# Patient Record
Sex: Male | Born: 1980 | Hispanic: Yes | Marital: Single | Smoking: Never smoker
Health system: Southern US, Community
[De-identification: ages and names within clinical notes are randomized; demographics above are authoritative.]

## PROBLEM LIST (undated history)

## (undated) DIAGNOSIS — K3580 Unspecified acute appendicitis: Secondary | ICD-10-CM

## (undated) HISTORY — PX: APPENDECTOMY: SHX54

---

## 2013-02-20 ENCOUNTER — Encounter (HOSPITAL_COMMUNITY): Payer: Self-pay | Admitting: Emergency Medicine

## 2013-02-20 ENCOUNTER — Observation Stay (HOSPITAL_COMMUNITY)
Admission: EM | Admit: 2013-02-20 | Discharge: 2013-02-21 | Disposition: A | Discharge status: Home / Self Care | Payer: Medicaid Other | Attending: General Surgery | Admitting: General Surgery

## 2013-02-20 DIAGNOSIS — K3580 Unspecified acute appendicitis: Principal | ICD-10-CM | POA: Insufficient documentation

## 2013-02-20 HISTORY — DX: Unspecified acute appendicitis: K35.80

## 2013-02-20 LAB — COMPREHENSIVE METABOLIC PANEL
Albumin: 4.4 g/dL (ref 3.5–5.2)
BUN: 23 mg/dL (ref 6–23)
CO2: 24 mEq/L (ref 19–32)
Calcium: 9.7 mg/dL (ref 8.4–10.5)
Chloride: 101 mEq/L (ref 96–112)
Creatinine, Ser: 0.86 mg/dL (ref 0.50–1.35)
GFR calc non Af Amer: >90 mL/min (ref >90)
Total Bilirubin: 0.7 mg/dL (ref 0.3–1.2)

## 2013-02-20 LAB — CBC WITH DIFFERENTIAL/PLATELET
Basophils Absolute: 0 10*3/uL (ref 0.0–0.1)
Eosinophils Absolute: 0.1 10*3/uL (ref 0.0–0.7)
HCT: 44.4 % (ref 39.0–52.0)
Hemoglobin: 15.8 g/dL (ref 13.0–17.0)
Lymphocytes Relative: 10 % — ABNORMAL LOW (ref 12–46)
Lymphs Abs: 1.6 10*3/uL (ref 0.7–4.0)
MCH: 30 pg (ref 26.0–34.0)
Monocytes Relative: 7 % (ref 3–12)
Neutro Abs: 12.8 10*3/uL — ABNORMAL HIGH (ref 1.7–7.7)
Neutrophils Relative %: 82 % — ABNORMAL HIGH (ref 43–77)
Platelets: 226 10*3/uL (ref 150–400)
RBC: 5.26 MIL/uL (ref 4.22–5.81)
RDW: 12.3 % (ref 11.5–15.5)
WBC: 15.6 10*3/uL — ABNORMAL HIGH (ref 4.0–10.5)

## 2013-02-20 LAB — LIPASE, BLOOD: Lipase: 40 U/L (ref 11–59)

## 2013-02-20 MED ORDER — IOHEXOL 300 MG/ML  SOLN: 50.0000 mL | Freq: Once | INTRAMUSCULAR | Status: AC | PRN

## 2013-02-20 MED ORDER — SODIUM CHLORIDE 0.9 % IV BOLUS (SEPSIS)
1000.0000 mL | Freq: Once | INTRAVENOUS | Status: AC
  Administered 2013-02-21: 1000 mL via INTRAVENOUS

## 2013-02-20 MED ORDER — HYDROMORPHONE HCL PF 1 MG/ML IJ SOLN
1.0000 mg | Freq: Once | INTRAMUSCULAR | Status: AC
  Administered 2013-02-21: 1 mg via INTRAVENOUS
  Filled 2013-02-20: qty 1

## 2013-02-20 MED ORDER — ONDANSETRON HCL 4 MG/2ML IJ SOLN
4.0000 mg | Freq: Once | INTRAMUSCULAR | Status: AC
  Administered 2013-02-20: 4 mg via INTRAVENOUS
  Filled 2013-02-20: qty 2

## 2013-02-20 NOTE — ED Provider Notes (Signed)
CSN: [id number]     Arrival date & time 02/20/13  2244 History   First MD Initiated Contact with Patient 02/20/13 2321     Chief Complaint  Patient presents with  . Abdominal Pain   (Consider location/radiation/quality/duration/timing/severity/associated sxs/prior Treatment) The history is provided by the patient and a friend. A language interpreter was used.  Brandon Bowers is a 32 y/o M with no significant PMHx presenting to the ED with abdominal pain that started at approximately 3:00PM this afternoon that has progressively gotten worse over the course of the day. Patient reported that the pain started out as a 3/10, stated that the pain has gotten worse with time. Patient reported that the pain is localized to the right lower quadrant, described as an inflammatory process without radiation. Stated that he has been experiencing mild back pain associated with the RLQ pain. Stated that having his legs extended makes the pain worse, stated that the flexion of the legs make the pain better. Stated that at approximately 5:00PM he noticed that he was experiencing epigastric discomfort described as a gaseous, bloating sensation. Patient reported that he had raw oysters. Stated that he has not had a BM today. Denied fever, nausea, vomiting, diarrhea, chills, chest pain, shortness of breath, difficulty breathing, melena, hematochezia, neck pain, neck stiffness, penile pain, penile discharge, penile swelling, testicular pain, testicular swelling, scrotal pain. PCP none Patient predominantly speaks [place], friend with patient who interpreted interview.   History reviewed. No pertinent past medical history. History reviewed. No pertinent past surgical history. History reviewed. No pertinent family history. History  Substance Use Topics  . Smoking status: Never Smoker   . Smokeless tobacco: Never Used  . Alcohol Use: Yes     Comment: socially    Review of Systems  Constitutional: Negative for  fever and chills.  HENT: Negative for neck pain and neck stiffness.   Respiratory: Negative for chest tightness and shortness of breath.   Cardiovascular: Negative for chest pain.  Gastrointestinal: Positive for abdominal pain. Negative for nausea and vomiting.  Genitourinary: Negative for dysuria, hematuria, decreased urine volume, discharge, penile swelling, scrotal swelling, difficulty urinating, penile pain and testicular pain.  Musculoskeletal: Positive for back pain.  Neurological: Negative for dizziness, weakness and headaches.  All other systems reviewed and are negative.    Allergies  Review of patient's allergies indicates no known allergies.  Home Medications   Current Outpatient Rx  Name  Route  Sig  Dispense  Refill  . HYDROcodone-acetaminophen (NORCO/VICODIN) 5-325 MG per tablet   Oral   Take 1-2 tablets by mouth every 4 (four) hours as needed.   40 tablet   0   . ibuprofen (ADVIL,MOTRIN) 200 MG tablet      You can  Take 2-3 of these every 6 hours for pain.   30 tablet   0    BP 112/69  Pulse 74  Temp(Src) 97.8 F (36.6 C) (Oral)  Resp 18  Ht 5\' 7"  (1.702 m)  Wt 160 lb (72.576 kg)  BMI 25.05 kg/m2  SpO2 96% Physical Exam  Nursing note and vitals reviewed. Constitutional: He is oriented to person, place, and time. He appears well-developed and well-nourished. No distress.  HENT:  Head: Normocephalic and atraumatic.  Mouth/Throat: Oropharynx is clear and moist. No oropharyngeal exudate.  Eyes: Conjunctivae and EOM are normal. Pupils are equal, round, and reactive to light. Right eye exhibits no discharge. Left eye exhibits no discharge.  Neck: Normal range of motion. Neck supple.  Negative  neck stiffness Negative nuchal rigidity Negative lymphadenopathy Negative meningeal signs  Cardiovascular: Normal rate, regular rhythm and normal heart sounds.  Exam reveals no friction rub.   No murmur heard. Pulses:      Radial pulses are 2+ on the right side,  and 2+ on the left side.       Dorsalis pedis pulses are 2+ on the right side, and 2+ on the left side.  Pulmonary/Chest: Effort normal and breath sounds normal. No respiratory distress. He has no wheezes. He has no rales.  Abdominal: Soft. Normal appearance and bowel sounds are normal. He exhibits no distension. There is tenderness in the right lower quadrant. There is rebound and tenderness at McBurney's point. There is no guarding and negative Murphy's sign.    Discomfort upon palpation to the RLQ Positive McBurney's point Positive psoas and obturator Positive rosvings  Lymphadenopathy:    He has no cervical adenopathy.  Neurological: He is alert and oriented to person, place, and time. He exhibits normal muscle tone. Coordination normal.  Skin: Skin is warm and dry. No rash noted. He is not diaphoretic. No erythema.  Psychiatric: He has a normal mood and affect. His behavior is normal. Thought content normal.    ED Course  Procedures (including critical care time)  1:53 AM Spoke with Dr. Ezzard Standing, [facility] General Surgery, discussed case and CT scan of abdomen findings consistent with acute appendicitis with elevation in WBC and lactic acid. Dr. Ezzard Standing aware and will come to see patient.   2:04 AM Spoke with patient and friend regarding lab and imaging results - discussed with patient that General Surgery is to come and see patient, discussed that patient will most likely need to get surgery performed, most likely need to get appendectomy.   Labs Review Labs Reviewed  CBC WITH DIFFERENTIAL - Abnormal; Notable for the following:    WBC 15.6 (*)    Neutrophils Relative % 82 (*)    Neutro Abs 12.8 (*)    Lymphocytes Relative 10 (*)    All other components within normal limits  CG4 I-STAT (LACTIC ACID) - Abnormal; Notable for the following:    Lactic Acid, Venous 3.11 (*)    All other components within normal limits  COMPREHENSIVE METABOLIC PANEL  LIPASE, BLOOD  URINALYSIS, ROUTINE W  REFLEX MICROSCOPIC   Imaging Review Ct Abdomen Pelvis W Contrast  02/21/2013   *RADIOLOGY REPORT*  Clinical Data: Severe right lower quadrant abdominal pain. Leukocytosis.  CT ABDOMEN AND PELVIS WITH CONTRAST  Technique:  Multidetector CT imaging of the abdomen and pelvis was performed following the standard protocol during bolus administration of intravenous contrast.  Contrast: OMNIPAQUE IOHEXOL 300 MG/ML  SOLN  Comparison: None.  Findings: Minimal bibasilar atelectasis is noted.  The liver and spleen are unremarkable in appearance.  The gallbladder is within normal limits.  The pancreas and adrenal glands are unremarkable.  The kidneys are unremarkable in appearance.  There is no evidence of hydronephrosis.  No renal or ureteral stones are seen.  No perinephric stranding is appreciated.  No free fluid is identified.  The small bowel is unremarkable in appearance.  The stomach is within normal limits.  No acute vascular abnormalities are seen.  The appendix is borderline prominent, measuring up to 8 mm in diameter, with minimal associated soft tissue stranding.  No free fluid is seen.  This could reflect very mild appendicitis.  There is no evidence of perforation or abscess formation.  The colon is unremarkable in appearance.  The bladder is moderately distended and grossly unremarkable in appearance.  A small urachal remnant is incidentally noted, with minimal associated fat.  The prostate remains normal in size.  No inguinal lymphadenopathy is seen.  No acute osseous abnormalities are identified.  There is partial sacralization of vertebral body L5.  IMPRESSION: Borderline prominent appendix, measuring up to 8 mm in diameter, with minimal associated soft tissue stranding.  This could reflect very mild appendicitis.  No evidence of perforation or abscess formation.  These results were called by telephone on 02/21/2013 at 01:31 a.m. to Dr. Rolan Bucco, who verbally acknowledged these results.    Original Report Authenticated By: Tonia Ghent, M.D.    MDM  No diagnosis found.  Patient presenting to the emergency department with abdominal pain that is localized to the right lower quadrant that started approximately 3:00 PM this afternoon. Patient reports that the pain feels like inflammation, localized to right lower quadrant without radiation. Stated he had associated symptoms of back pain. Denied nausea, vomiting, diarrhea, fever, chills. Friend who accompanied patient was interpreter for the interview. Alert and oriented. Heart rate and rhythm normal. Lungs clear to auscultation bilaterally. Pulses strong and palpable, distal and proximal bilaterally. Full range of motion to upper and lower extremities bilaterally. Bowel sounds normoactive in all 4 quadrants. Pain upon palpation localized to the right lower quadrant-positive psoas, positive obturator, positive Rovsing sign, positive McBurney's point. Suspicion high for appendicitis. Urine negative for infection, negative leukocytosis identified. CMP negative findings. Lipase negative elevation. CBC elevation of white blood cells noted, 15.6 with mild leukocytosis. Lactic acid elevated at 3.11. CT abdomen and pelvis with contrast identified appendix measuring up to 8 mm in diameter with minimal associated soft tissue stranding - mild appendicitis - no evidence of perforation or abscess formation noted. Discussed case with Dr. Ezzard Standing, [facility] General Surgery - patient to be taken to the OR for appendectomy to be performed. Discussed lab and imaging results with patient, discussed need for surgery - patient understood and agreed to plan. Patient stable, afebrile for transfer.      Raymon Mutton, PA-C 02/22/13 [phone]

## 2013-02-20 NOTE — ED Notes (Signed)
Pt reports severe RLQ sudden onset, pin point tenderness. Denies n/v/d.  Reports pain began at 1500 but increased dramatically 10 minutes ago.

## 2013-02-21 ENCOUNTER — Encounter (HOSPITAL_COMMUNITY): Payer: Self-pay | Admitting: Anesthesiology

## 2013-02-21 ENCOUNTER — Emergency Department (HOSPITAL_COMMUNITY): Payer: Medicaid Other | Admitting: Anesthesiology

## 2013-02-21 ENCOUNTER — Encounter (HOSPITAL_COMMUNITY): Payer: Self-pay

## 2013-02-21 ENCOUNTER — Encounter (HOSPITAL_COMMUNITY)
Admission: EM | Disposition: A | Discharge status: Home / Self Care | Payer: Self-pay | Source: Home / Self Care | Attending: Emergency Medicine

## 2013-02-21 ENCOUNTER — Encounter: Payer: Self-pay | Admitting: General Surgery

## 2013-02-21 ENCOUNTER — Emergency Department (HOSPITAL_COMMUNITY): Payer: Medicaid Other

## 2013-02-21 DIAGNOSIS — K3580 Unspecified acute appendicitis: Secondary | ICD-10-CM

## 2013-02-21 HISTORY — PX: LAPAROSCOPIC APPENDECTOMY: SHX408

## 2013-02-21 LAB — URINALYSIS, ROUTINE W REFLEX MICROSCOPIC
Hgb urine dipstick: NEGATIVE
Ketones, ur: NEGATIVE mg/dL
Leukocytes, UA: NEGATIVE
Nitrite: NEGATIVE
Specific Gravity, Urine: 1.017 (ref 1.005–1.030)
pH: 8 (ref 5.0–8.0)

## 2013-02-21 LAB — CG4 I-STAT (LACTIC ACID): Lactic Acid, Venous: 3.11 mmol/L — ABNORMAL HIGH (ref 0.5–2.2)

## 2013-02-21 SURGERY — APPENDECTOMY, LAPAROSCOPIC
Anesthesia: General | Site: Abdomen | Wound class: Clean Contaminated

## 2013-02-21 MED ORDER — IBUPROFEN 200 MG PO TABS: ORAL_TABLET | ORAL | Status: AC

## 2013-02-21 MED ORDER — CEFAZOLIN SODIUM-DEXTROSE 2-3 GM-% IV SOLR
INTRAVENOUS | Status: AC
  Filled 2013-02-21: qty 50

## 2013-02-21 MED ORDER — SODIUM CHLORIDE 0.9 % IV BOLUS (SEPSIS)
1000.0000 mL | Freq: Once | INTRAVENOUS | Status: AC
  Administered 2013-02-21: 1000 mL via INTRAVENOUS

## 2013-02-21 MED ORDER — HYDROCODONE-ACETAMINOPHEN 5-325 MG PO TABS
1.0000 | ORAL_TABLET | ORAL | Status: DC | PRN
  Administered 2013-02-21: 2 via ORAL
  Filled 2013-02-21: qty 2

## 2013-02-21 MED ORDER — MORPHINE SULFATE 2 MG/ML IJ SOLN: 1.0000 mg | INTRAMUSCULAR | Status: DC | PRN

## 2013-02-21 MED ORDER — HYDROCODONE-ACETAMINOPHEN 5-325 MG PO TABS: 1.0000 | ORAL_TABLET | ORAL | Status: DC | PRN

## 2013-02-21 MED ORDER — ROCURONIUM BROMIDE 100 MG/10ML IV SOLN
INTRAVENOUS | Status: DC | PRN
  Administered 2013-02-21: 35 mg via INTRAVENOUS

## 2013-02-21 MED ORDER — DEXTROSE 5 % IV SOLN
1.0000 g | Freq: Four times a day (QID) | INTRAVENOUS | Status: DC
  Administered 2013-02-21 (×2): 1 g via INTRAVENOUS
  Filled 2013-02-21 (×3): qty 1

## 2013-02-21 MED ORDER — KETOROLAC TROMETHAMINE 30 MG/ML IJ SOLN: 15.0000 mg | Freq: Once | INTRAMUSCULAR | Status: DC | PRN

## 2013-02-21 MED ORDER — NEOSTIGMINE METHYLSULFATE 1 MG/ML IJ SOLN
INTRAMUSCULAR | Status: DC | PRN
  Administered 2013-02-21: 4 mg via INTRAVENOUS

## 2013-02-21 MED ORDER — LACTATED RINGERS IR SOLN
Status: DC | PRN
  Administered 2013-02-21: 1000 mL

## 2013-02-21 MED ORDER — PROPOFOL 10 MG/ML IV BOLUS
INTRAVENOUS | Status: DC | PRN
  Administered 2013-02-21: 20 mg via INTRAVENOUS
  Administered 2013-02-21: 180 mg via INTRAVENOUS

## 2013-02-21 MED ORDER — IOHEXOL 300 MG/ML  SOLN
50.0000 mL | Freq: Once | INTRAMUSCULAR | Status: AC | PRN
  Administered 2013-02-21: 50 mL via ORAL

## 2013-02-21 MED ORDER — ONDANSETRON HCL 4 MG/2ML IJ SOLN
INTRAMUSCULAR | Status: DC | PRN
  Administered 2013-02-21: 4 mg via INTRAVENOUS

## 2013-02-21 MED ORDER — ONDANSETRON HCL 4 MG/2ML IJ SOLN: 4.0000 mg | Freq: Four times a day (QID) | INTRAMUSCULAR | Status: DC | PRN

## 2013-02-21 MED ORDER — MIDAZOLAM HCL 5 MG/5ML IJ SOLN
INTRAMUSCULAR | Status: DC | PRN
  Administered 2013-02-21: 2 mg via INTRAVENOUS

## 2013-02-21 MED ORDER — MEPERIDINE HCL 50 MG/ML IJ SOLN: 6.2500 mg | INTRAMUSCULAR | Status: DC | PRN

## 2013-02-21 MED ORDER — BUPIVACAINE HCL (PF) 0.25 % IJ SOLN
INTRAMUSCULAR | Status: DC | PRN
  Administered 2013-02-21: 23 mL

## 2013-02-21 MED ORDER — BUPIVACAINE HCL (PF) 0.25 % IJ SOLN
INTRAMUSCULAR | Status: AC
  Filled 2013-02-21: qty 30

## 2013-02-21 MED ORDER — LACTATED RINGERS IV SOLN
INTRAVENOUS | Status: DC | PRN
  Administered 2013-02-21 (×2): via INTRAVENOUS

## 2013-02-21 MED ORDER — DEXAMETHASONE SODIUM PHOSPHATE 10 MG/ML IJ SOLN
INTRAMUSCULAR | Status: DC | PRN
  Administered 2013-02-21: 10 mg via INTRAVENOUS

## 2013-02-21 MED ORDER — PROMETHAZINE HCL 25 MG/ML IJ SOLN: 6.2500 mg | INTRAMUSCULAR | Status: DC | PRN

## 2013-02-21 MED ORDER — CEFOXITIN SODIUM 1 G IV SOLR
INTRAVENOUS | Status: AC
  Filled 2013-02-21: qty 1

## 2013-02-21 MED ORDER — GLYCOPYRROLATE 0.2 MG/ML IJ SOLN
INTRAMUSCULAR | Status: DC | PRN
  Administered 2013-02-21: 0.6 mg via INTRAVENOUS

## 2013-02-21 MED ORDER — IBUPROFEN 600 MG PO TABS
600.0000 mg | ORAL_TABLET | Freq: Four times a day (QID) | ORAL | Status: DC | PRN
  Administered 2013-02-21 (×2): 600 mg via ORAL
  Filled 2013-02-21 (×3): qty 1

## 2013-02-21 MED ORDER — KCL IN DEXTROSE-NACL 20-5-0.45 MEQ/L-%-% IV SOLN
INTRAVENOUS | Status: DC
  Administered 2013-02-21: 06:00:00 via INTRAVENOUS
  Filled 2013-02-21 (×2): qty 1000

## 2013-02-21 MED ORDER — LIDOCAINE HCL (CARDIAC) 20 MG/ML IV SOLN
INTRAVENOUS | Status: DC | PRN
  Administered 2013-02-21: 75 mg via INTRAVENOUS

## 2013-02-21 MED ORDER — SUFENTANIL CITRATE 50 MCG/ML IV SOLN
INTRAVENOUS | Status: DC | PRN
  Administered 2013-02-21: 10 ug via INTRAVENOUS

## 2013-02-21 MED ORDER — DEXTROSE 5 % IV SOLN
2.0000 g | INTRAVENOUS | Status: DC | PRN
  Administered 2013-02-21: 2 g via INTRAVENOUS

## 2013-02-21 MED ORDER — SUCCINYLCHOLINE CHLORIDE 20 MG/ML IJ SOLN
INTRAMUSCULAR | Status: DC | PRN
  Administered 2013-02-21: 100 mg via INTRAVENOUS

## 2013-02-21 MED ORDER — FENTANYL CITRATE 0.05 MG/ML IJ SOLN: 25.0000 ug | INTRAMUSCULAR | Status: DC | PRN

## 2013-02-21 MED ORDER — HEPARIN SODIUM (PORCINE) 5000 UNIT/ML IJ SOLN
5000.0000 [IU] | Freq: Three times a day (TID) | INTRAMUSCULAR | Status: DC
  Administered 2013-02-21: 5000 [IU] via SUBCUTANEOUS
  Filled 2013-02-21 (×3): qty 1

## 2013-02-21 MED ORDER — KETOROLAC TROMETHAMINE 30 MG/ML IJ SOLN
INTRAMUSCULAR | Status: DC | PRN
  Administered 2013-02-21: 30 mg via INTRAVENOUS

## 2013-02-21 MED ORDER — IOHEXOL 300 MG/ML  SOLN
100.0000 mL | Freq: Once | INTRAMUSCULAR | Status: AC | PRN
  Administered 2013-02-21: 100 mL via INTRAVENOUS

## 2013-02-21 MED ORDER — MIDAZOLAM HCL 2 MG/2ML IJ SOLN: 0.5000 mg | Freq: Once | INTRAMUSCULAR | Status: DC | PRN

## 2013-02-21 MED ORDER — ONDANSETRON HCL 4 MG PO TABS: 4.0000 mg | ORAL_TABLET | Freq: Four times a day (QID) | ORAL | Status: DC | PRN

## 2013-02-21 MED ORDER — IBUPROFEN 600 MG PO TABS
600.0000 mg | ORAL_TABLET | Freq: Four times a day (QID) | ORAL | Status: DC | PRN
  Filled 2013-02-21: qty 1

## 2013-02-21 MED ORDER — DEXTROSE 5 % IV SOLN
INTRAVENOUS | Status: AC
  Filled 2013-02-21: qty 1

## 2013-02-21 MED ORDER — 0.9 % SODIUM CHLORIDE (POUR BTL) OPTIME
TOPICAL | Status: DC | PRN
  Administered 2013-02-21: 1000 mL

## 2013-02-21 SURGICAL SUPPLY — 37 items
APPLIER CLIP ROT 10 11.4 M/L (STAPLE)
BENZOIN TINCTURE PRP APPL 2/3 (GAUZE/BANDAGES/DRESSINGS) ×2 IMPLANT
CANISTER SUCTION 2500CC (MISCELLANEOUS) ×2 IMPLANT
CLIP APPLIE ROT 10 11.4 M/L (STAPLE) IMPLANT
CLOTH BEACON ORANGE TIMEOUT ST (SAFETY) ×2 IMPLANT
CUTTER FLEX LINEAR 45M (STAPLE) ×2 IMPLANT
DECANTER SPIKE VIAL GLASS SM (MISCELLANEOUS) ×2 IMPLANT
DERMABOND ADVANCED (GAUZE/BANDAGES/DRESSINGS)
DERMABOND ADVANCED .7 DNX12 (GAUZE/BANDAGES/DRESSINGS) IMPLANT
DRAPE LAPAROSCOPIC ABDOMINAL (DRAPES) ×2 IMPLANT
ELECT REM PT RETURN 9FT ADLT (ELECTROSURGICAL) ×2
ELECTRODE REM PT RTRN 9FT ADLT (ELECTROSURGICAL) ×1 IMPLANT
ENDOLOOP SUT PDS II  0 18 (SUTURE)
ENDOLOOP SUT PDS II 0 18 (SUTURE) IMPLANT
GLOVE BIOGEL PI IND STRL 7.0 (GLOVE) ×1 IMPLANT
GLOVE BIOGEL PI INDICATOR 7.0 (GLOVE) ×1
GLOVE SURG SIGNA 7.5 PF LTX (GLOVE) ×2 IMPLANT
GOWN PREVENTION PLUS LG XLONG (DISPOSABLE) ×2 IMPLANT
GOWN STRL REIN XL XLG (GOWN DISPOSABLE) ×4 IMPLANT
KIT BASIN OR (CUSTOM PROCEDURE TRAY) ×2 IMPLANT
PENCIL BUTTON HOLSTER BLD 10FT (ELECTRODE) IMPLANT
POUCH SPECIMEN RETRIEVAL 10MM (ENDOMECHANICALS) ×2 IMPLANT
RELOAD 45 VASCULAR/THIN (ENDOMECHANICALS) IMPLANT
RELOAD STAPLE TA45 3.5 REG BLU (ENDOMECHANICALS) ×2 IMPLANT
SCALPEL HARMONIC ACE (MISCELLANEOUS) ×2 IMPLANT
SET IRRIG TUBING LAPAROSCOPIC (IRRIGATION / IRRIGATOR) ×2 IMPLANT
SOLUTION ANTI FOG 6CC (MISCELLANEOUS) ×2 IMPLANT
STRIP CLOSURE SKIN 1/2X4 (GAUZE/BANDAGES/DRESSINGS) ×2 IMPLANT
SUT MON AB 5-0 PS2 18 (SUTURE) ×2 IMPLANT
TOWEL OR 17X26 10 PK STRL BLUE (TOWEL DISPOSABLE) ×2 IMPLANT
TOWEL OR NON WOVEN STRL DISP B (DISPOSABLE) ×2 IMPLANT
TRAY FOLEY CATH 14FRSI W/METER (CATHETERS) IMPLANT
TRAY LAP CHOLE (CUSTOM PROCEDURE TRAY) ×2 IMPLANT
TROCAR BLADELESS OPT 5 100 (ENDOMECHANICALS) ×2 IMPLANT
TROCAR XCEL BLUNT TIP 100MML (ENDOMECHANICALS) ×2 IMPLANT
TROCAR XCEL NON-BLD 11X100MML (ENDOMECHANICALS) ×2 IMPLANT
TUBING INSUFFLATION 10FT LAP (TUBING) ×2 IMPLANT

## 2013-02-21 NOTE — Care Management Note (Signed)
    Page 1 of 1   02/21/2013     10:46:48 AM   CARE MANAGEMENT NOTE 02/21/2013  Patient:  Brandon Bowers, Brandon Bowers   Account Number:  [id number]  Date Initiated:  02/21/2013  Documentation initiated by:  Lorenda Ishihara  Subjective/Objective Assessment:   32 yo male admitted s/p lap appy     Action/Plan:   [place] home alone   Anticipated DC Date:  02/21/2013   Anticipated DC Plan:  HOME/SELF CARE      DC Planning Services  CM consult      Choice offered to / List presented to:             Status of service:  Completed, signed off Medicare Important Message given?   (If response is "NO", the following Medicare IM given date fields will be blank) Date Medicare IM given:   Date Additional Medicare IM given:    Discharge Disposition:  HOME/SELF CARE  Per UR Regulation:  Reviewed for med. necessity/level of care/duration of stay  If discussed at Long Length of Stay Meetings, dates discussed:    Comments:

## 2013-02-21 NOTE — Progress Notes (Signed)
General Surgery [name]  Discussed with Dr. Ezzard Standing and Zola Button.  Encourage ambulation, monitor po intake.  Potentially home later today.  Velora Heckler, MD, [name] Office: [phone]

## 2013-02-21 NOTE — Anesthesia Preprocedure Evaluation (Addendum)
Anesthesia Evaluation  Patient identified by MRN, date of birth, ID band Patient awake    Reviewed: Allergy & Precautions, H&P , Patient's Chart, lab work & pertinent test results, reviewed documented beta blocker date and time   History of Anesthesia Complications Negative for: history of anesthetic complications  Airway Mallampati: II TM Distance: >3 FB Neck ROM: full    Dental no notable dental hx. (+)    Pulmonary neg pulmonary ROS,  breath sounds clear to auscultation  Pulmonary exam normal       Cardiovascular Exercise Tolerance: Good negative cardio ROS  Rhythm:regular Rate:Normal     Neuro/Psych negative neurological ROS  negative psych ROS   GI/Hepatic negative GI ROS, Neg liver ROS,   Endo/Other  negative endocrine ROS  Renal/GU negative Renal ROS     Musculoskeletal   Abdominal   Peds  Hematology negative hematology ROS (+)   Anesthesia Other Findings Chip right front tooth  Reproductive/Obstetrics negative OB ROS                         Anesthesia Physical Anesthesia Plan  ASA: II and emergent  Anesthesia Plan: General ETT   Post-op Pain Management:    Induction: Rapid sequence, Cricoid pressure planned and Intravenous  Airway Management Planned: Oral ETT  Additional Equipment:   Intra-op Plan:   Post-operative Plan:   Informed Consent: I have reviewed the patients History and Physical, chart, labs and discussed the procedure including the risks, benefits and alternatives for the proposed anesthesia with the patient or authorized representative who has indicated his/her understanding and acceptance.   Dental Advisory Given  Plan Discussed with: CRNA and Surgeon  Anesthesia Plan Comments:        Anesthesia Quick Evaluation

## 2013-02-21 NOTE — H&P (Addendum)
Re:   Brandon Bowers DOB:   Dec 01, 1980 MRN:   [id number]  [facility] Admit Note  ASSESSMENT AND PLAN: 1.  Appendicitis  I discussed with the patient the indications and risks of appendiceal surgery.  The primary risks of appendiceal surgery include, but are not limited to, bleeding, infection, bowel surgery, and open surgery.  There is also the risk that the patient may have continued symptoms after surgery.  However, the likelihood of improvement in symptoms and return to the patient's normal status is good. We discussed the typical post-operative recovery course. I tried to answer the patient's questions.  We discussed length of hospitalization and getting back to work.  Chief Complaint  Patient presents with  . Abdominal Pain   REFERRING PHYSICIAN:  Dr. Rolan Bowers,  [facility]  HISTORY OF PRESENT ILLNESS: Brandon Bowers is a 32 y.o. (DOB: August 01, 1980)  hispanic  male whose primary care physician is No primary provider on file. and comes to [facility] ER with abdominal pain. He has a friend, Brandon Bowers, who acted as Nurse, learning disability.  His brother, Brandon Bowers, and sister-in-law were in the room.  He has been in otherwise good health.  Yesterday afternoon he started having epigastric pain and a headache.  The pain worsened an localized to the RLQ.  He came to the [facility] because of the worsening pain.  He has not prior history of stomach, liver, or colon disease.  He has had no prior surgery.  CT scan - 02/21/2013 - Borderline prominent appendix, measuring up to 8 mm in diameter, with minimal associated soft tissue stranding. This could reflect very mild appendicitis.  WBC - 15,600 - 02/20/2013    History reviewed. No pertinent past medical history.   History reviewed. No pertinent past surgical history.    No current facility-administered medications for this encounter.   No current outpatient prescriptions on file.     No Known Allergies  REVIEW OF SYSTEMS: Skin:  No history of rash.     Infection:  No history of infection. Neurologic:  Had a headache when his abdominal pain began.  Not chronic. Cardiac:  No history of hypertension. No history of heart disease.   Pulmonary:  Does not smoke cigarettes.    Endocrine:  No diabetes.  Gastrointestinal:  See HPI. Urologic:  No history of kidney stones.  No history of bladder infections. Musculoskeletal:  No history of joint or back disease. Hematologic:  No bleeding disorder.   Psycho-social:  The patient is oriented.  SOCIAL and FAMILY HISTORY: Unmarried. From [place]. 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  PHYSICAL EXAM: BP 157/74  Pulse 96  Temp(Src) 97.6 F (36.4 C) (Oral)  Resp 24  SpO2 99%  General: WN Hispanic male who is alert and generally healthy appearing.  HEENT: Normal. Pupils equal. Neck: Supple. No mass.  No thyroid mass. Lymph Nodes:  No supraclavicular or cervical nodes. Lungs: Clear to auscultation and symmetric breath sounds. Heart:  RRR. No murmur or rub. Abdomen: Soft. No mass.  No hernia. Normal bowel sounds.  No abdominal scars.  Some tenderness in the RLQ.  No peritoneal signs. Rectal: Not done. Extremities:  Good strength and ROM  in upper and lower extremities. Neurologic:  Grossly intact to motor and sensory function. Psychiatric:  Behavior is normal.   DATA REVIEWED: Epic notes.  Ovidio Kin, MD,  [facility] [address]  [address]   [place], [place]    [address] Phone:  [phone] FAX:  [phone]  

## 2013-02-21 NOTE — Progress Notes (Signed)
Day of Surgery  Subjective: He speaks some English, still pretty sore and tender.  Hasn't been up much yet.  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  Objective: Vital signs in last 24 hours: Temp:  [97.6 F (36.4 C)-98.7 F (37.1 C)] 98.1 F (36.7 C) (10/06 0818) Pulse Rate:  [63-96] 63 (10/06 0818) Resp:  [9-24] 18 (10/06 0818) BP: (104-157)/(48-74) 104/48 mmHg (10/06 0818) SpO2:  [95 %-100 %] 97 % (10/06 0818) Weight:  [72.576 kg (160 lb)] 72.576 kg (160 lb) (10/06 0518)   Diet:  Clears Afebrile, VSS No labs this AM   Intake/Output from previous day: 10/05 0701 - 10/06 0700 In: 1305 [I.V.:1305] Out: 0  Intake/Output this shift:    General appearance: alert, cooperative, no distress and still very sore. GI: soft, tender, dressing dry, and undressed incision looks fine.  Lab Results:   Recent Labs  02/20/13 2310  WBC 15.6*  HGB 15.8  HCT 44.4  PLT 226    BMET  Recent Labs  02/20/13 2310  NA 137  K 3.5  CL 101  CO2 24  GLUCOSE 99  BUN 23  CREATININE 0.86  CALCIUM 9.7   PT/INR No results found for this basename: LABPROT, INR,  in the last 72 hours   Recent Labs Lab 02/20/13 2310  AST 19  ALT 28  ALKPHOS 77  BILITOT 0.7  PROT 7.6  ALBUMIN 4.4     Lipase     Component Value Date/Time   LIPASE 40 02/20/2013 2310     Studies/Results: Ct Abdomen Pelvis W Contrast  02/21/2013   *RADIOLOGY REPORT*  Clinical Data: Severe right lower quadrant abdominal pain. Leukocytosis.  CT ABDOMEN AND PELVIS WITH CONTRAST  Technique:  Multidetector CT imaging of the abdomen and pelvis was performed following the standard protocol during bolus administration of intravenous contrast.  Contrast: OMNIPAQUE IOHEXOL 300 MG/ML  SOLN  Comparison: None.  Findings: Minimal bibasilar atelectasis is noted.  The liver and spleen are unremarkable in appearance.  The gallbladder is within normal limits.  The pancreas and adrenal glands are unremarkable.  The kidneys are  unremarkable in appearance.  There is no evidence of hydronephrosis.  No renal or ureteral stones are seen.  No perinephric stranding is appreciated.  No free fluid is identified.  The small bowel is unremarkable in appearance.  The stomach is within normal limits.  No acute vascular abnormalities are seen.  The appendix is borderline prominent, measuring up to 8 mm in diameter, with minimal associated soft tissue stranding.  No free fluid is seen.  This could reflect very mild appendicitis.  There is no evidence of perforation or abscess formation.  The colon is unremarkable in appearance.  The bladder is moderately distended and grossly unremarkable in appearance.  A small urachal remnant is incidentally noted, with minimal associated fat.  The prostate remains normal in size.  No inguinal lymphadenopathy is seen.  No acute osseous abnormalities are identified.  There is partial sacralization of vertebral body L5.  IMPRESSION: Borderline prominent appendix, measuring up to 8 mm in diameter, with minimal associated soft tissue stranding.  This could reflect very mild appendicitis.  No evidence of perforation or abscess formation.  These results were called by telephone on 02/21/2013 at 01:31 a.m. to Dr. Rolan Bucco, who verbally acknowledged these results.   Original Report Authenticated By: Tonia Ghent, M.D.    Medications: . cefOXitin  1 g Intravenous Q6H  . heparin subcutaneous  5,000 Units Subcutaneous Q8H    Assessment/Plan Hemorrhagic appendicitis.  S/p Laparoscopic appendectomy. 02/21/2013, Brandon Cocking, MD   PLan:  Brandon Bowers, advacne his diet and if he does well home later today or in Am     LOS: 1 day    Brandon Bowers 02/21/2013

## 2013-02-21 NOTE — Transfer of Care (Signed)
Immediate Anesthesia Transfer of Care Note  Patient: Brandon Bowers  Procedure(s) Performed: Procedure(s): APPENDECTOMY LAPAROSCOPIC (N/A)  Patient Location: PACU  Anesthesia Type:General  Level of Consciousness: awake, alert , oriented and patient cooperative  Airway & Oxygen Therapy: Patient Spontanous Breathing and Patient connected to face mask oxygen  Post-op Assessment: Report given to PACU RN, Post -op Vital signs reviewed and stable and Patient moving all extremities X 4  Post vital signs: stable  Complications: No apparent anesthesia complications

## 2013-02-21 NOTE — Op Note (Addendum)
Re:   Oral Remache DOB:   [birth year] MRN:   [id number]                   FACILITY:  [facility]  DATE OF PROCEDURE: 02/21/2013                              OPERATIVE REPORT  PREOPERATIVE DIAGNOSIS:  Hemorrhagic appendicitis  POSTOPERATIVE DIAGNOSIS:  Hemorrhagic appendicitis.  PROCEDURE:  Laparoscopic appendectomy.  SURGEON:  Sandria Bales. Ezzard Standing, MD  ASSISTANT:  No first assistant.  ANESTHESIA:  General endotracheal.  No anesthesia staff entered.  ASA:  2E  ESTIMATED BLOOD LOSS:  Minimal.  DRAINS: none   SPECIMEN:   Appendix  COUNTS CORRECT:  YES  INDICATIONS FOR PROCEDURE: Brandon Bowers is a 32 y.o. (DOB: [birth year]) hispanic  male whose primary care doctor is No primary provider on file. and comes to the OR for an appendectomy.   I discussed with the patient, the indications and potential complications of appendiceal surgery.  The potential complications include, but are not limited to, bleeding, open surgery, bowel resection, and the possibility of another diagnosis.  OPERATIVE NOTE:  The patient underwent a general endotracheal anesthetic as supervised by No anesthesia staff entered., General, in room #1 at [facility].  The patient was given 2 g of cefoxitin at the beginning of the procedure and the abdomen was prepped with ChloraPrep.  The patient did not have a foley catheter placed.  A time-out was held and surgical checklist run.  An infraumbilical incision was made with sharp dissection carried down to the abdominal cavity.  An 12 mm Hasson trocar was inserted through the infraumbilical incision and into the peritoneal cavity.  A 0 degree 10 mm laparoscope was inserted through a 12 mm Hasson trocar and the Hasson trocar secured with a 0 Vicryl suture.  I placed a 5 mm trocar in the right upper quadrant and 11 mm torcar in left lower quadrant and did abdominal exploration.    The right and left lobes of liver unremarkable.  Stomach was unremarkable.  The pelvic organs  were unremarkable.  I saw no other intra-abdominal abnormality.  The patient had appendicitis with the appendix located edge of the right pelvic brim.  The mesentery of the appendix was divided with a Harmonic scalpel.  I got to the base of the appendix.  I then used a blue load 45 mm Ethicon Endo-GIA stapler and fired this across the base of the appendix.  I placed the appendix in EndoCatch bag and delivered the bag through the umbilical incision.  I irrigated the abdomen with 200 cc of saline.  After irrigating the abdomen, I then removed the trocars, in turn.  The umbilical port fascia was closed with 0 Vicryl suture.   I closed the skin each site with a 5-0 Vicryl suture and painted the wounds with Dermabond.  I then injected a total of 23 mL of 0.25% Marcaine at the incisions.  Sponge and needle count were correct at the end of the case. The patient was transferred to the recovery room in good condition.  The patient tolerated the procedure well and it depends on the patient's post op clinical course as to when she could be discharged.   Ovidio Kin, MD, [facility] Pager: [phone] Office phone:  [phone]

## 2013-02-22 ENCOUNTER — Encounter (HOSPITAL_COMMUNITY): Payer: Self-pay | Admitting: General Surgery

## 2013-02-22 DIAGNOSIS — K3580 Unspecified acute appendicitis: Secondary | ICD-10-CM

## 2013-02-22 HISTORY — DX: Unspecified acute appendicitis: K35.80

## 2013-02-22 NOTE — Discharge Summary (Signed)
General Surgery [name]  Agree with summary and plans for follow up at [facility].  Velora Heckler, MD, [name] Office: [phone]

## 2013-02-22 NOTE — Discharge Summary (Signed)
Physician Discharge Summary  Patient ID: Brandon Bowers MRN: [id number] DOB/AGE: July 19, 1980 32 y.o.  Admit date: 02/20/2013 Discharge date: 02/22/2013  Admission Diagnoses:  Appendicitis  Discharge Diagnoses: Same Principal Problem:   Appendicitis, acute   PROCEDURES: Laparoscopic appendectomy. 02/21/2013, Sandria Bales. Ezzard Standing, MD.     Hospital Course: [name] is a 32 y.o. (DOB: [birth year]) hispanic male whose primary care physician is No primary provider on file. and comes to [facility] ER with abdominal pain.  He has a friend, Anna-Dine, who acted as Nurse, learning disability. His brother, Ardeen Garland, and sister-in-law were in the room.  He has been in otherwise good health. Yesterday afternoon he started having epigastric pain and a headache. The pain worsened an localized to the RLQ. He came to the [facility] because of the worsening pain. He has not prior history of stomach, liver, or colon disease. He has had no prior surgery.  Pt tolerated the procedure and returned to the room.  He was mobilized and his diet advanced.  He was ready for discharge later the first  Post op afternoon.  Disposition: 01-Home or Self Care     Medication List         HYDROcodone-acetaminophen 5-325 MG per tablet  Commonly known as:  NORCO/VICODIN  Take 1-2 tablets by mouth every 4 (four) hours as needed.     ibuprofen 200 MG tablet  Commonly known as:  ADVIL,MOTRIN  You can  Take 2-3 of these every 6 hours for pain.       Follow-up Information   Follow up with [facility], MD. Schedule an appointment as soon as possible for a visit in 2 weeks.   Specialty:  General Surgery   Contact information:   [address] [place] [address] [phone]       Signed: Sherrie George 02/22/2013, 11:17 AM

## 2013-02-24 NOTE — Anesthesia Postprocedure Evaluation (Signed)
  Anesthesia Post Note  Patient: Brandon Bowers  Procedure(s) Performed: Procedure(s) (LRB): APPENDECTOMY LAPAROSCOPIC (N/A)  Anesthesia type: GA  Patient location: PACU  Post pain: Pain level controlled  Post assessment: Post-op Vital signs reviewed  Last Vitals:  Filed Vitals:   02/21/13 1323  BP: 112/69  Pulse: 74  Temp: 36.6 C  Resp: 18    Post vital signs: Reviewed  Level of consciousness: sedated  Complications: No apparent anesthesia complications

## 2013-02-28 ENCOUNTER — Encounter (INDEPENDENT_AMBULATORY_CARE_PROVIDER_SITE_OTHER): Payer: Self-pay

## 2013-02-28 ENCOUNTER — Telehealth (INDEPENDENT_AMBULATORY_CARE_PROVIDER_SITE_OTHER): Payer: Self-pay

## 2013-02-28 NOTE — ED Provider Notes (Signed)
Medical screening examination/treatment/procedure(s) were performed by non-physician practitioner and as supervising physician I was immediately available for consultation/collaboration.   Makyia Erxleben, MD 02/28/13 2006 

## 2013-02-28 NOTE — Telephone Encounter (Signed)
Pt is po appendectomy on 02/21/13, and is requesting a refill of pain medication.  He speaks no [place], so an interpreter will call us back with more information.

## 2013-03-09 ENCOUNTER — Ambulatory Visit (INDEPENDENT_AMBULATORY_CARE_PROVIDER_SITE_OTHER): Payer: Self-pay | Admitting: Surgery

## 2013-03-09 ENCOUNTER — Encounter (INDEPENDENT_AMBULATORY_CARE_PROVIDER_SITE_OTHER): Payer: Self-pay | Admitting: Surgery

## 2013-03-09 VITALS — BP 120/66 | HR 64 | Temp 97.8°F | Resp 14 | Ht 67.5 in | Wt 152.2 lb

## 2013-03-09 DIAGNOSIS — K3580 Unspecified acute appendicitis: Secondary | ICD-10-CM

## 2013-03-09 NOTE — Progress Notes (Signed)
[facility]  Ovidio Kin, MD,  FACS [address] [place]  [address] [place], [place]    [address] Phone:  [phone] FAX:  [phone]   Re:   Brandon Bowers DOB:   [birth year] MRN:   [id number]  ASSESSMENT AND PLAN: 1.  S/P appendicitis  Lap appendectomy - 02/21/2013 - D. Trista Ciocca  Has done well.  Return PRN.  HISTORY OF PRESENT ILLNESS: Chief Complaint  Patient presents with  . Routine Post Op    1st p/o appy     Brandon Bowers is a 32 y.o. (DOB: [birth year])  hispanic  male who is a patient of HOOPER,JEFFREY C, MD and comes to me today for follow up of an appendectomy.  A friend, Eduard Clos, came with the patient and acted as a Nurse, learning disability.  He has some soreness.  He asked about the incisions.  But he is doing well.  Past Medical History  Diagnosis Date  . Appendicitis, acute 02/22/2013   SOCIAL HISTORY:  PHYSICAL EXAM: BP 120/66  Pulse 64  Temp(Src) 97.8 F (36.6 C) (Temporal)  Resp 14  Ht 5' 7.5" (1.715 m)  Wt 152 lb 3.2 oz (69.037 kg)  BMI 23.47 kg/m2  Abdomen: Looks good. Incisions okay.  DATA REVIEWED: Path to patient.  Ovidio Kin, MD,  [facility] [address] [place]  [address]   [place], [place]    [address] Phone:  [phone] FAX:  [phone]

## 2013-03-17 ENCOUNTER — Encounter (INDEPENDENT_AMBULATORY_CARE_PROVIDER_SITE_OTHER): Payer: Self-pay | Admitting: Surgery

## 2014-09-25 IMAGING — CT CT ABD-PELV W/ CM
1 of 2 series · 15 of 32 positions shown, 19 images · IV contrast (100 ML OMNI 300)
Comparison: None.

CLINICAL DATA: Severe right lower quadrant abdominal pain.
Leukocytosis.

CT ABDOMEN AND PELVIS WITH CONTRAST
TECHNIQUE: Multidetector CT imaging of the abdomen and pelvis was
performed following the standard protocol during bolus
administration of intravenous contrast.
Contrast: 100mL OMNIPAQUE IOHEXOL 300 MG/ML  SOLN

[Series 2: abd/pel with · axial · 0.63mm/px · z∈[-399,+6]mm · 15 of 89 slices shown, 19 images]
[im 4/89  soft-tissue]
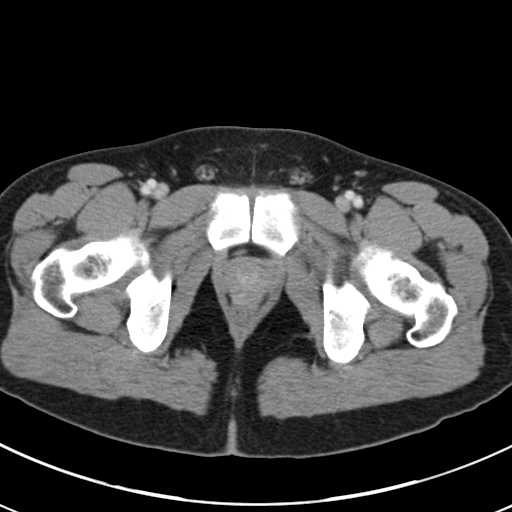
[im 4/89  bone]
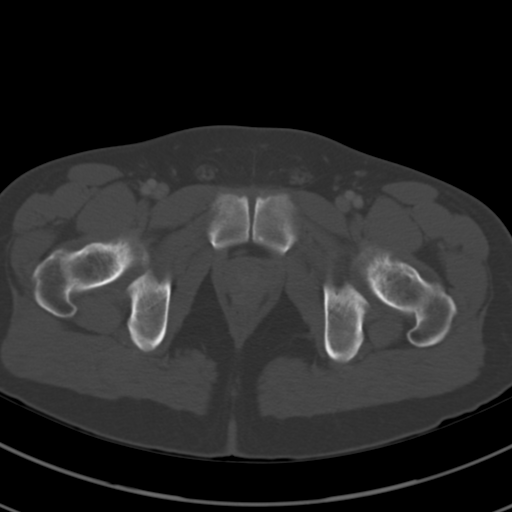
[im 11/89  soft-tissue]
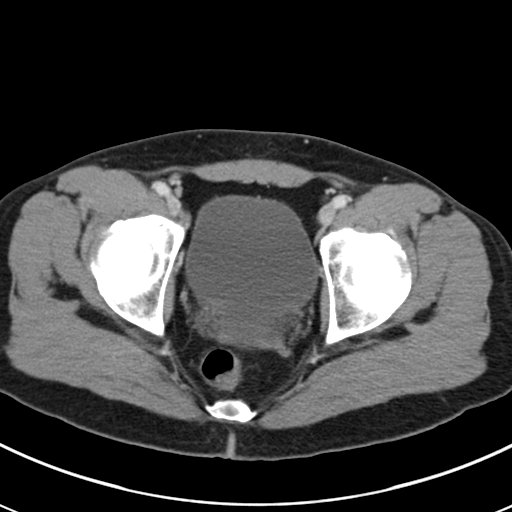
[im 17/89  soft-tissue]
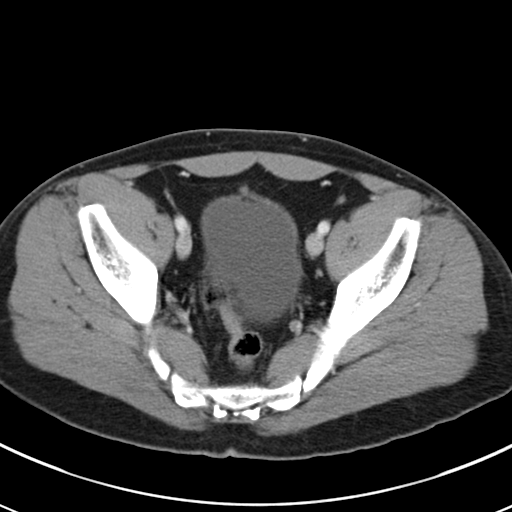
[im 24/89  soft-tissue]
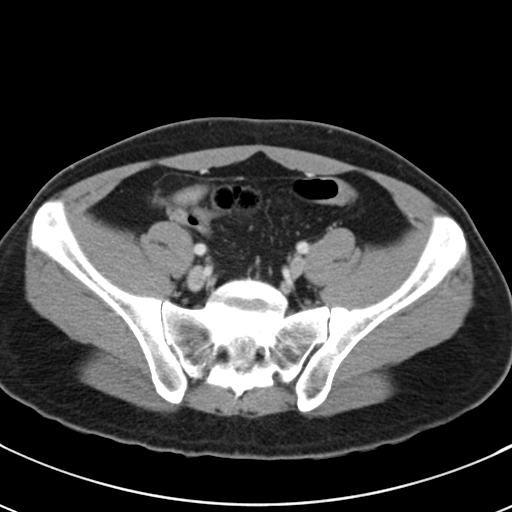
[im 31/89  soft-tissue]
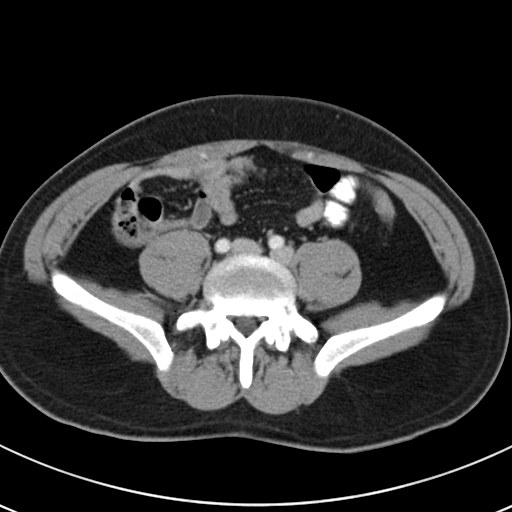
[im 38/89  soft-tissue]
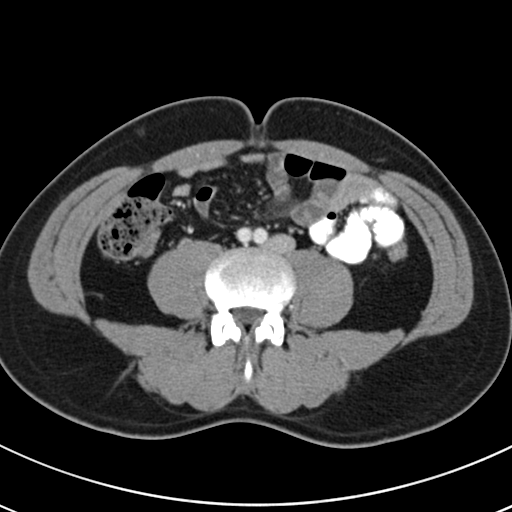
[im 45/89  soft-tissue]
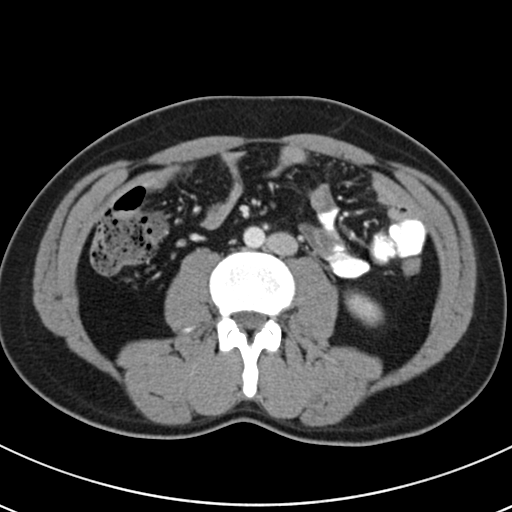
[im 51/89  soft-tissue]
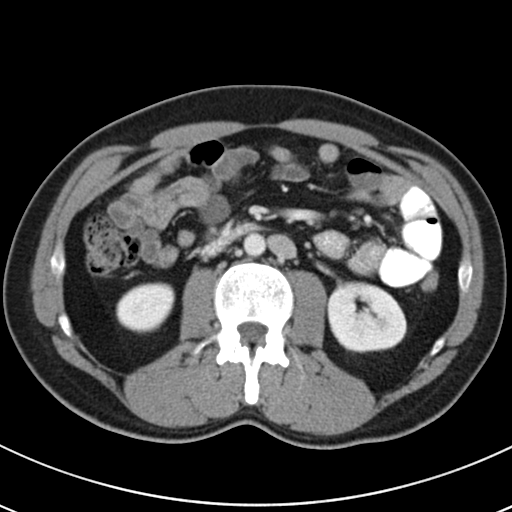
[im 58/89  soft-tissue]
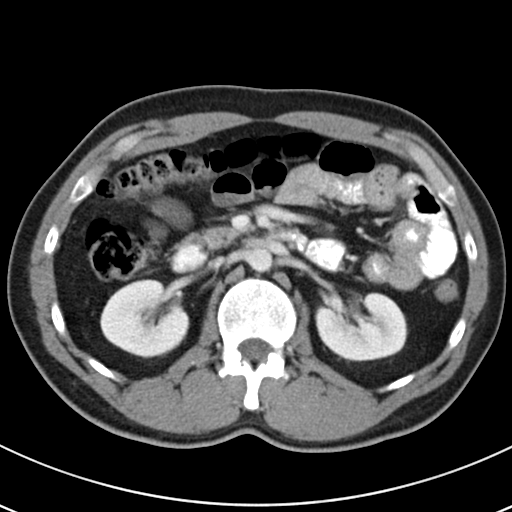
[im 58/89  bone]
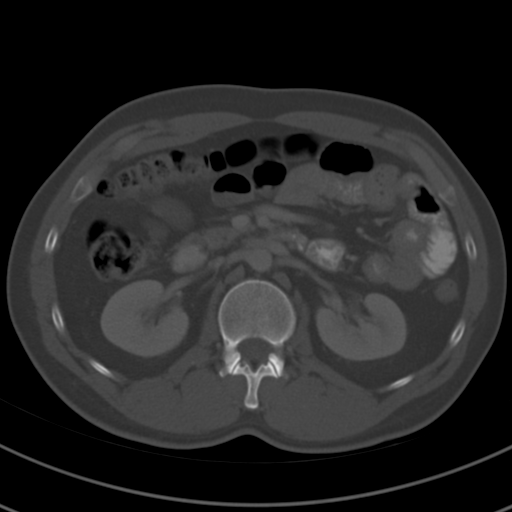
[im 65/89  soft-tissue]
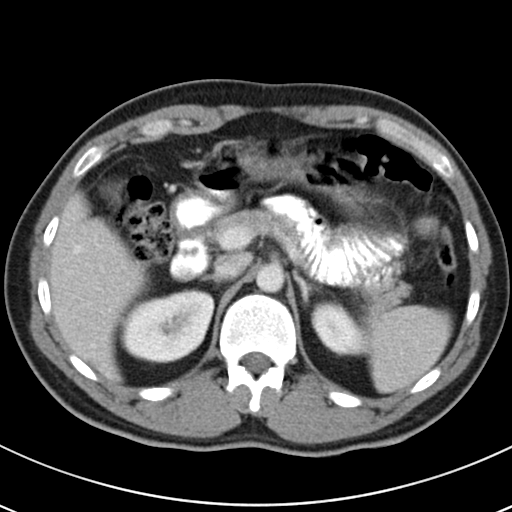
[im 72/89  soft-tissue]
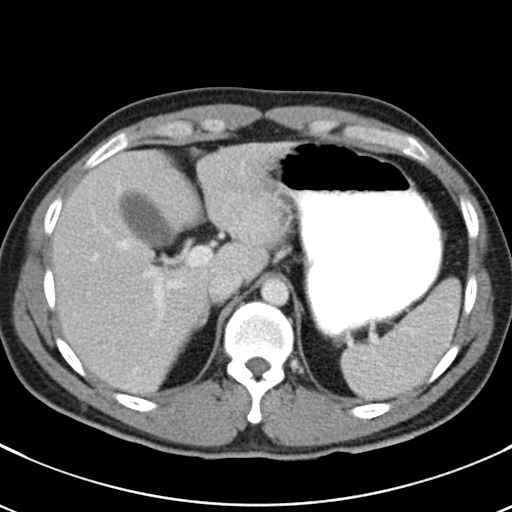
[im 75/89  lung]
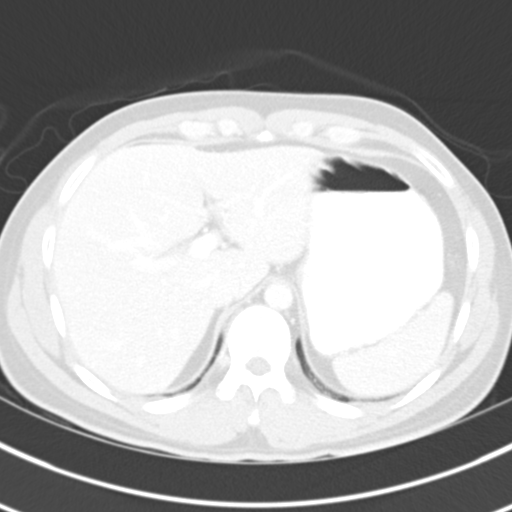
[im 78/89  soft-tissue]
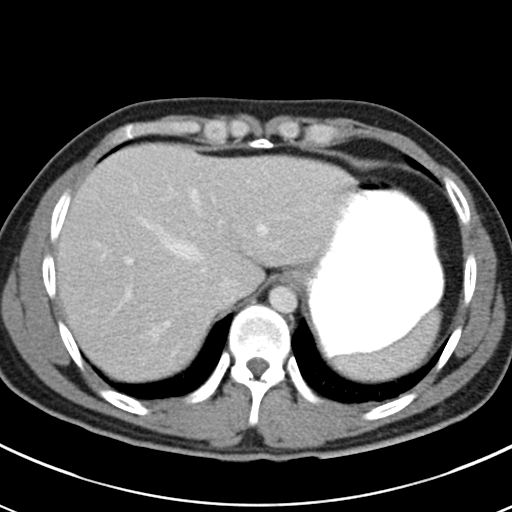
[im 78/89  lung]
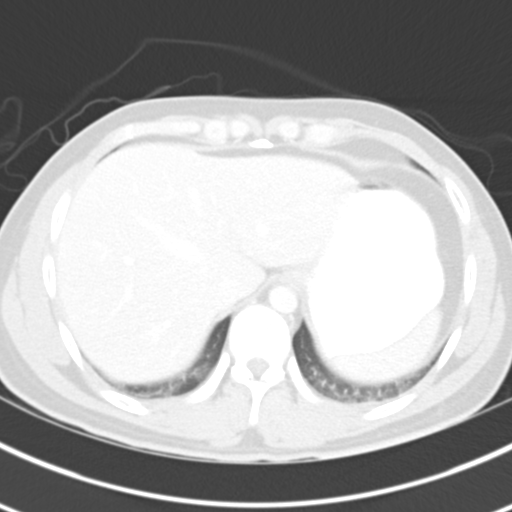
[im 82/89  lung]
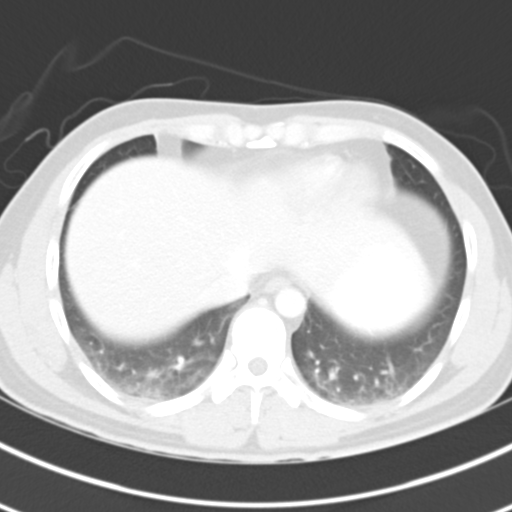
[im 85/89  soft-tissue]
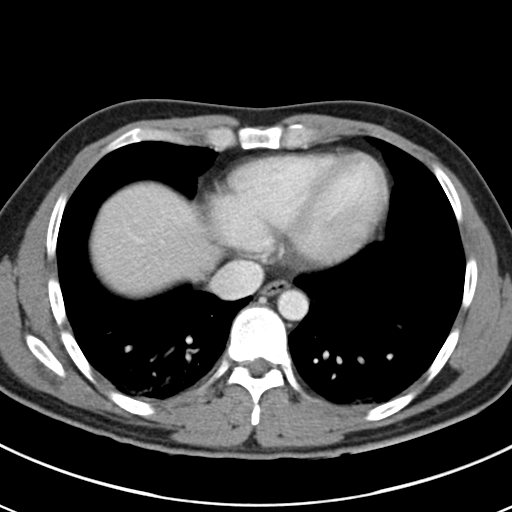
[im 85/89  lung]
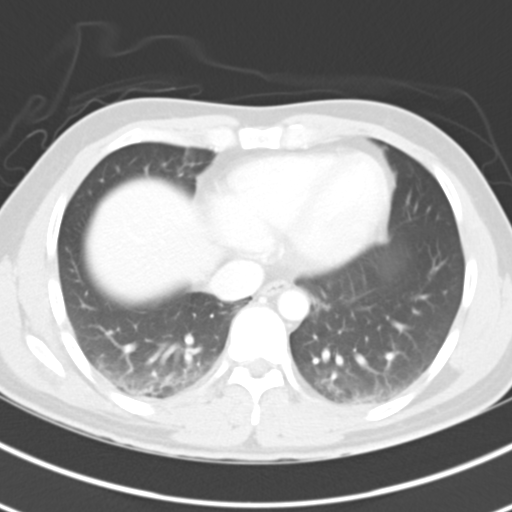

[15 of 32 positions shown; findings below may reference images not displayed]

FINDINGS: Minimal bibasilar atelectasis is noted.

The liver and spleen are unremarkable in appearance.  The
gallbladder is within normal limits.  The pancreas and adrenal
glands are unremarkable.

The kidneys are unremarkable in appearance.  There is no evidence
of hydronephrosis.  No renal or ureteral stones are seen.  No
perinephric stranding is appreciated.

No free fluid is identified.  The small bowel is unremarkable in
appearance.  The stomach is within normal limits.  No acute
vascular abnormalities are seen.

The appendix is borderline prominent, measuring up to 8 mm in
diameter, with minimal associated soft tissue stranding.  No free
fluid is seen.  This could reflect very mild appendicitis.  There
is no evidence of perforation or abscess formation.

The colon is unremarkable in appearance.

The bladder is moderately distended and grossly unremarkable in
appearance.  A small urachal remnant is incidentally noted, with
minimal associated fat.  The prostate remains normal in size.  No
inguinal lymphadenopathy is seen.

No acute osseous abnormalities are identified.  There is partial
sacralization of vertebral body L5.
IMPRESSION: Borderline prominent appendix, measuring up to 8 mm in diameter,
with minimal associated soft tissue stranding.  This could reflect
very mild appendicitis.  No evidence of perforation or abscess
formation.

to Dr. Martty Zalazar, who verbally acknowledged these results.

## 2020-01-28 ENCOUNTER — Ambulatory Visit
Admission: EM | Admit: 2020-01-28 | Discharge: 2020-01-28 | Disposition: A | Discharge status: Home / Self Care | Payer: Self-pay | Attending: Emergency Medicine | Admitting: Emergency Medicine

## 2020-01-28 ENCOUNTER — Other Ambulatory Visit: Payer: Self-pay

## 2020-01-28 ENCOUNTER — Encounter: Payer: Self-pay | Admitting: Emergency Medicine

## 2020-01-28 DIAGNOSIS — K649 Unspecified hemorrhoids: Secondary | ICD-10-CM

## 2020-01-28 DIAGNOSIS — K602 Anal fissure, unspecified: Secondary | ICD-10-CM

## 2020-01-28 MED ORDER — LIDOCAINE VISCOUS HCL 2 % MT SOLN: 15.0000 mL | OROMUCOSAL | 0 refills | Status: AC | PRN

## 2020-01-28 NOTE — ED Provider Notes (Signed)
[facility]    CSN: [id number] Arrival date & time: 01/28/20  0805      History   Chief Complaint Chief Complaint  Patient presents with  . Abdominal Pain    HPI Brandon Bowers is a 39 y.o. male presenting for 70-month course of perianal/rectal irritation.  Interpretation provided by patient's friend: Declined video services.  Described as burning sensation, worse with thicker stools.  Denies history of hemorrhoids.  Does not feel any rectal tissue prolapsing.  Denies trauma to area.  Denies sudden worsening.  No hematochezia or melena.  Has 2-3 bowel movements daily without change thereof.  No urinary symptoms, fever, nausea or vomiting.  States that he has tried a cream that he received from a clinic last month with some relief.  Weight stable.  Does not have PCP currently.  Past Medical History:  Diagnosis Date  . Appendicitis, acute 02/22/2013    Patient Active Problem List   Diagnosis Date Noted  . Appendicitis, acute 02/22/2013    Past Surgical History:  Procedure Laterality Date  . APPENDECTOMY    . LAPAROSCOPIC APPENDECTOMY N/A 02/21/2013   Procedure: APPENDECTOMY LAPAROSCOPIC;  Surgeon: Kandis Cocking, MD;  Location: [facility];  Service: General;  Laterality: N/A;       Home Medications    Prior to Admission medications   Medication Sig Start Date End Date Taking? Authorizing Provider  ibuprofen (ADVIL,MOTRIN) 200 MG tablet You can  Take 2-3 of these every 6 hours for pain. 02/21/13   Sherrie George, PA-C  lidocaine (XYLOCAINE) 2 % solution Use as directed 15 mLs in the mouth or throat as needed for mouth pain. 01/28/20   Hall-Potvin, [place], PA-C    Family History History reviewed. No pertinent family history.  Social History Social History   Tobacco Use  . Smoking status: Never Smoker  . Smokeless tobacco: Never Used  Substance Use Topics  . Alcohol use: Yes    Comment: socially  . Drug use: No     Allergies   Patient has no  known allergies.   Review of Systems As per HPI   Physical Exam Triage Vital Signs ED Triage Vitals [01/28/20 0806]  Enc Vitals Group     BP 120/74     Pulse Rate 60     Resp 16     Temp 98 F (36.7 C)     Temp Source Oral     SpO2 98 %     Weight      Height      Head Circumference      Peak Flow      Pain Score      Pain Loc      Pain Edu?      Excl. in GC?    No data found.  Updated Vital Signs BP 120/74 (BP Location: Left Arm)   Pulse 60   Temp 98 F (36.7 C) (Oral)   Resp 16   SpO2 98%   Visual Acuity Right Eye Distance:   Left Eye Distance:   Bilateral Distance:    Right Eye Near:   Left Eye Near:    Bilateral Near:     Physical Exam Constitutional:      General: He is not in acute distress. HENT:     Head: Normocephalic and atraumatic.  Eyes:     General: No scleral icterus.    Pupils: Pupils are equal, round, and reactive to light.  Cardiovascular:     Rate  and Rhythm: Normal rate.  Pulmonary:     Effort: Pulmonary effort is normal. No respiratory distress.     Breath sounds: No wheezing.  Abdominal:     General: Abdomen is flat. Bowel sounds are normal.     Palpations: Abdomen is soft. There is no hepatomegaly or splenomegaly.     Tenderness: There is no abdominal tenderness. There is no guarding or rebound. Negative signs include Murphy's sign, Rovsing's sign and McBurney's sign.  Genitourinary:    Comments: Small posterior anal fissure without surrounding erythema, rash, discharge.  Mildly tender.  No external hemorrhoids noted.  Internal exam deferred Skin:    Coloration: Skin is not jaundiced or pale.  Neurological:     Mental Status: He is alert and oriented to person, place, and time.      UC Treatments / Results  Labs (all labs ordered are listed, but only abnormal results are displayed) Labs Reviewed - No data to display  EKG   Radiology No results found.  Procedures Procedures (including critical care  time)  Medications Ordered in UC Medications - No data to display  Initial Impression / Assessment and Plan / UC Course  I have reviewed the triage vital signs and the nursing notes.  Pertinent labs & imaging results that were available during my care of the patient were reviewed by me and considered in my medical decision making (see chart for details).     H&P consistent with small, superficial anal fissure.  Likely internal hemorrhoids as well.  Will treat supportively as outlined below.  Provided contact information for low cost PCP local to patient.  Return precautions discussed, pt verbalized understanding and is agreeable to plan. Final Clinical Impressions(s) / UC Diagnoses   Final diagnoses:  Anal fissure  Hemorrhoids, unspecified hemorrhoid type     Discharge Instructions     Miralax daily. Hydrocortisone. Sitz bath.    ED Prescriptions    Medication Sig Dispense Auth. Provider   lidocaine (XYLOCAINE) 2 % solution Use as directed 15 mLs in the mouth or throat as needed for mouth pain. 100 mL Hall-Potvin, [place], PA-C     PDMP not reviewed this encounter.   Hall-Potvin, [place], [place] 01/28/20 [phone]

## 2020-01-28 NOTE — Discharge Instructions (Addendum)
Miralax daily. Hydrocortisone. Sitz bath.

## 2020-01-28 NOTE — ED Triage Notes (Signed)
Pt here with some abd discomfort and straining to defecate; pt with some rectal itching as well

## 2022-09-18 ENCOUNTER — Telehealth (HOSPITAL_COMMUNITY): Payer: Self-pay

## 2022-09-18 ENCOUNTER — Ambulatory Visit (HOSPITAL_COMMUNITY)
Admission: EM | Admit: 2022-09-18 | Discharge: 2022-09-18 | Disposition: A | Discharge status: Home / Self Care | Payer: Self-pay | Attending: Internal Medicine | Admitting: Internal Medicine

## 2022-09-18 ENCOUNTER — Encounter (HOSPITAL_COMMUNITY): Payer: Self-pay

## 2022-09-18 DIAGNOSIS — N529 Male erectile dysfunction, unspecified: Secondary | ICD-10-CM

## 2022-09-18 DIAGNOSIS — F524 Premature ejaculation: Secondary | ICD-10-CM | POA: Insufficient documentation

## 2022-09-18 LAB — TSH: TSH: 1.593 u[IU]/mL (ref 0.350–4.500)

## 2022-09-18 LAB — CBC WITH DIFFERENTIAL/PLATELET
Abs Immature Granulocytes: 0.02 10*3/uL (ref 0.00–0.07)
Basophils Absolute: 0.1 10*3/uL (ref 0.0–0.1)
Basophils Relative: 1 %
Eosinophils Absolute: 0.2 10*3/uL (ref 0.0–0.5)
Eosinophils Relative: 4 %
HCT: 43.2 % (ref 39.0–52.0)
Hemoglobin: 14.6 g/dL (ref 13.0–17.0)
Immature Granulocytes: 0 %
Lymphocytes Relative: 39 %
Lymphs Abs: 2.2 10*3/uL (ref 0.7–4.0)
MCH: 29.3 pg (ref 26.0–34.0)
MCHC: 33.8 g/dL (ref 30.0–36.0)
MCV: 86.6 fL (ref 80.0–100.0)
Monocytes Absolute: 0.4 10*3/uL (ref 0.1–1.0)
Monocytes Relative: 8 %
Neutro Abs: 2.8 10*3/uL (ref 1.7–7.7)
Neutrophils Relative %: 48 %
Platelets: 221 10*3/uL (ref 150–400)
RBC: 4.99 MIL/uL (ref 4.22–5.81)
RDW: 12.8 % (ref 11.5–15.5)
WBC: 5.7 10*3/uL (ref 4.0–10.5)
nRBC: 0 % (ref 0.0–0.2)

## 2022-09-18 LAB — COMPREHENSIVE METABOLIC PANEL
ALT: 25 U/L (ref 0–44)
AST: 19 U/L (ref 15–41)
Albumin: 4 g/dL (ref 3.5–5.0)
Alkaline Phosphatase: 73 U/L (ref 38–126)
Anion gap: 8 (ref 5–15)
BUN: 13 mg/dL (ref 6–20)
CO2: 25 mmol/L (ref 22–32)
Calcium: 9.1 mg/dL (ref 8.9–10.3)
Chloride: 104 mmol/L (ref 98–111)
Creatinine, Ser: 0.8 mg/dL (ref 0.61–1.24)
GFR, Estimated: >60 mL/min (ref >60)
Glucose, Bld: 93 mg/dL (ref 70–99)
Potassium: 4.2 mmol/L (ref 3.5–5.1)
Sodium: 137 mmol/L (ref 135–145)
Total Bilirubin: 0.6 mg/dL (ref 0.3–1.2)
Total Protein: 6.7 g/dL (ref 6.5–8.1)

## 2022-09-18 NOTE — ED Triage Notes (Signed)
Pt states via interpretor that when he is having intercourse he doesn't last too long and wants to know if there is a medicine that can fix it.  States it has been going on for a year.

## 2022-09-18 NOTE — Telephone Encounter (Signed)
Lab called about this patients blood sample  that was accidentally destroyed (spun down) I called  Patient back to re draw lab work.   Patient returned @ 12:30 for redraw.

## 2022-09-18 NOTE — Discharge Instructions (Addendum)
We will call you with recommendations if labs are abnormal I will recommend you follow-up with a urologist for further evaluation Return to urgent care if you have any other concerns

## 2022-09-18 NOTE — ED Provider Notes (Signed)
[facility]    CSN: [id number] Arrival date & time: 09/18/22  0805      History   Chief Complaint Chief Complaint  Patient presents with   Erectile Dysfunction    HPI Brandon Bowers is a 41 y.o. male comes to urgent care with complaints of premature ejaculation.  Patient says the symptoms have started recently.  He is in a monogamous relationship.  He previously was able to sustain sexual intercourse for about 10 to 15 minutes.  Currently he is able to sustain sexual intercourse for a maximum of about 5 minutes.  This is putting a lot of stress on her relationship with his wife.  He denies any weight changes, cold intolerance or warm intolerance.  No history of heart disease.  He has been under a lot of stress recently.  Stressors related to work.  He denies any depressed mood.  He is unhappy about the situation but denies any depression.  No changes in sleep pattern.  No headaches or visual problems. HPI  Past Medical History:  Diagnosis Date   Appendicitis, acute 02/22/2013    Patient Active Problem List   Diagnosis Date Noted   Appendicitis, acute 02/22/2013    Past Surgical History:  Procedure Laterality Date   APPENDECTOMY     LAPAROSCOPIC APPENDECTOMY N/A 02/21/2013   Procedure: APPENDECTOMY LAPAROSCOPIC;  Surgeon: Kandis Cocking, MD;  Location: [facility];  Service: General;  Laterality: N/A;       Home Medications    Prior to Admission medications   Medication Sig Start Date End Date Taking? Authorizing Provider  ibuprofen (ADVIL,MOTRIN) 200 MG tablet You can  Take 2-3 of these every 6 hours for pain. 02/21/13   Sherrie George, PA-C  lidocaine (XYLOCAINE) 2 % solution Use as directed 15 mLs in the mouth or throat as needed for mouth pain. 01/28/20   Hall-Potvin, [place], PA-C    Family History History reviewed. No pertinent family history.  Social History Social History   Tobacco Use   Smoking status: Never   Smokeless tobacco: Never   Substance Use Topics   Alcohol use: Yes    Comment: socially   Drug use: No     Allergies   Patient has no known allergies.   Review of Systems Review of Systems As per HPI  Physical Exam Triage Vital Signs ED Triage Vitals  Enc Vitals Group     BP 09/18/22 0825 120/74     Pulse Rate 09/18/22 0825 60     Resp 09/18/22 0825 16     Temp 09/18/22 0825 (!) 97.4 F (36.3 C)     Temp Source 09/18/22 0825 Oral     SpO2 09/18/22 0825 99 %     Weight --      Height --      Head Circumference --      Peak Flow --      Pain Score 09/18/22 0827 0     Pain Loc --      Pain Edu? --      Excl. in GC? --    No data found.  Updated Vital Signs BP 120/74 (BP Location: Right Arm)   Pulse 60   Temp (!) 97.4 F (36.3 C) (Oral)   Resp 16   SpO2 99%   Visual Acuity Right Eye Distance:   Left Eye Distance:   Bilateral Distance:    Right Eye Near:   Left Eye Near:    Bilateral Near:  Physical Exam Vitals and nursing note reviewed.  Constitutional:      General: He is not in acute distress.    Appearance: Normal appearance. He is not ill-appearing.  Cardiovascular:     Rate and Rhythm: Normal rate and regular rhythm.  Pulmonary:     Effort: Pulmonary effort is normal.     Breath sounds: Normal breath sounds.  Abdominal:     General: Bowel sounds are normal.     Palpations: Abdomen is soft.  Musculoskeletal:        General: Normal range of motion.  Neurological:     Mental Status: He is alert.      UC Treatments / Results  Labs (all labs ordered are listed, but only abnormal results are displayed) Labs Reviewed  CBC WITH DIFFERENTIAL/PLATELET  COMPREHENSIVE METABOLIC PANEL  TSH    EKG   Radiology No results found.  Procedures Procedures (including critical care time)  Medications Ordered in UC Medications - No data to display  Initial Impression / Assessment and Plan / UC Course  I have reviewed the triage vital signs and the nursing  notes.  Pertinent labs & imaging results that were available during my care of the patient were reviewed by me and considered in my medical decision making (see chart for details).     1.  Premature ejaculation: CBC, CMP, TSH Patient is advised to follow-up with the urology for further evaluation and management. Will call patient with recommendations if labs are abnormal Return precautions given.  Final Clinical Impressions(s) / UC Diagnoses   Final diagnoses:  Erectile dysfunction, unspecified erectile dysfunction type     Discharge Instructions      We will call you with recommendations if labs are abnormal I will recommend you follow-up with a urologist for further evaluation Return to urgent care if you have any other concerns    ED Prescriptions   None    PDMP not reviewed this encounter.   Merrilee Jansky, MD 09/18/22 [phone]
